# Patient Record
Sex: Female | Born: 1975 | Race: White | Hispanic: No | Smoking: Former smoker
Health system: Southern US, Community
[De-identification: ages and names within clinical notes are randomized; demographics above are authoritative.]

## PROBLEM LIST (undated history)

## (undated) DIAGNOSIS — G629 Polyneuropathy, unspecified: Secondary | ICD-10-CM

## (undated) DIAGNOSIS — M5136 Other intervertebral disc degeneration, lumbar region: Secondary | ICD-10-CM

## (undated) DIAGNOSIS — M5134 Other intervertebral disc degeneration, thoracic region: Secondary | ICD-10-CM

## (undated) DIAGNOSIS — C801 Malignant (primary) neoplasm, unspecified: Secondary | ICD-10-CM

## (undated) DIAGNOSIS — M50323 Other cervical disc degeneration at C6-C7 level: Secondary | ICD-10-CM

## (undated) DIAGNOSIS — M51369 Other intervertebral disc degeneration, lumbar region without mention of lumbar back pain or lower extremity pain: Secondary | ICD-10-CM

## (undated) HISTORY — PX: BREAST SURGERY: SHX581

## (undated) HISTORY — PX: ABDOMINAL HYSTERECTOMY: SHX81

---

## 2008-10-26 DIAGNOSIS — K219 Gastro-esophageal reflux disease without esophagitis: Secondary | ICD-10-CM | POA: Insufficient documentation

## 2010-11-13 DIAGNOSIS — E669 Obesity, unspecified: Secondary | ICD-10-CM | POA: Insufficient documentation

## 2010-11-13 DIAGNOSIS — M461 Sacroiliitis, not elsewhere classified: Secondary | ICD-10-CM | POA: Insufficient documentation

## 2010-11-13 DIAGNOSIS — J45909 Unspecified asthma, uncomplicated: Secondary | ICD-10-CM | POA: Insufficient documentation

## 2010-12-16 DIAGNOSIS — R1312 Dysphagia, oropharyngeal phase: Secondary | ICD-10-CM | POA: Insufficient documentation

## 2012-01-28 DIAGNOSIS — C50919 Malignant neoplasm of unspecified site of unspecified female breast: Secondary | ICD-10-CM | POA: Insufficient documentation

## 2012-03-09 DIAGNOSIS — J45901 Unspecified asthma with (acute) exacerbation: Secondary | ICD-10-CM | POA: Insufficient documentation

## 2013-06-27 DIAGNOSIS — G4733 Obstructive sleep apnea (adult) (pediatric): Secondary | ICD-10-CM | POA: Insufficient documentation

## 2014-01-05 ENCOUNTER — Emergency Department: Payer: Self-pay | Admitting: Emergency Medicine

## 2014-03-14 DIAGNOSIS — G5603 Carpal tunnel syndrome, bilateral upper limbs: Secondary | ICD-10-CM | POA: Insufficient documentation

## 2014-04-13 DIAGNOSIS — G894 Chronic pain syndrome: Secondary | ICD-10-CM | POA: Insufficient documentation

## 2014-04-13 DIAGNOSIS — G629 Polyneuropathy, unspecified: Secondary | ICD-10-CM | POA: Insufficient documentation

## 2014-05-05 DIAGNOSIS — N2889 Other specified disorders of kidney and ureter: Secondary | ICD-10-CM | POA: Insufficient documentation

## 2014-06-22 DIAGNOSIS — F32A Depression, unspecified: Secondary | ICD-10-CM | POA: Insufficient documentation

## 2014-08-17 DIAGNOSIS — N393 Stress incontinence (female) (male): Secondary | ICD-10-CM | POA: Insufficient documentation

## 2014-09-21 DIAGNOSIS — K2100 Gastro-esophageal reflux disease with esophagitis, without bleeding: Secondary | ICD-10-CM | POA: Insufficient documentation

## 2015-05-11 DIAGNOSIS — J45909 Unspecified asthma, uncomplicated: Secondary | ICD-10-CM | POA: Insufficient documentation

## 2015-05-11 DIAGNOSIS — Z853 Personal history of malignant neoplasm of breast: Secondary | ICD-10-CM | POA: Insufficient documentation

## 2015-05-11 DIAGNOSIS — G8929 Other chronic pain: Secondary | ICD-10-CM | POA: Insufficient documentation

## 2015-05-11 DIAGNOSIS — Z7689 Persons encountering health services in other specified circumstances: Secondary | ICD-10-CM | POA: Insufficient documentation

## 2015-05-11 DIAGNOSIS — M533 Sacrococcygeal disorders, not elsewhere classified: Secondary | ICD-10-CM | POA: Insufficient documentation

## 2015-05-11 DIAGNOSIS — J301 Allergic rhinitis due to pollen: Secondary | ICD-10-CM | POA: Insufficient documentation

## 2015-06-24 DIAGNOSIS — L02219 Cutaneous abscess of trunk, unspecified: Secondary | ICD-10-CM | POA: Insufficient documentation

## 2015-11-15 DIAGNOSIS — K279 Peptic ulcer, site unspecified, unspecified as acute or chronic, without hemorrhage or perforation: Secondary | ICD-10-CM | POA: Insufficient documentation

## 2016-11-01 DIAGNOSIS — F1220 Cannabis dependence, uncomplicated: Secondary | ICD-10-CM | POA: Insufficient documentation

## 2016-11-01 DIAGNOSIS — F410 Panic disorder [episodic paroxysmal anxiety] without agoraphobia: Secondary | ICD-10-CM | POA: Insufficient documentation

## 2019-02-07 ENCOUNTER — Other Ambulatory Visit: Payer: Self-pay

## 2019-02-07 ENCOUNTER — Encounter: Payer: Self-pay | Admitting: Emergency Medicine

## 2019-02-07 ENCOUNTER — Ambulatory Visit
Admission: EM | Admit: 2019-02-07 | Discharge: 2019-02-07 | Disposition: A | Discharge status: Home / Self Care | Payer: Medicaid Other | Attending: Internal Medicine | Admitting: Internal Medicine

## 2019-02-07 ENCOUNTER — Emergency Department: Payer: Medicaid Other

## 2019-02-07 DIAGNOSIS — S161XXA Strain of muscle, fascia and tendon at neck level, initial encounter: Secondary | ICD-10-CM

## 2019-02-07 DIAGNOSIS — F17210 Nicotine dependence, cigarettes, uncomplicated: Secondary | ICD-10-CM | POA: Insufficient documentation

## 2019-02-07 DIAGNOSIS — Z79899 Other long term (current) drug therapy: Secondary | ICD-10-CM | POA: Diagnosis not present

## 2019-02-07 DIAGNOSIS — R51 Headache: Secondary | ICD-10-CM | POA: Diagnosis not present

## 2019-02-07 DIAGNOSIS — G4459 Other complicated headache syndrome: Secondary | ICD-10-CM | POA: Diagnosis not present

## 2019-02-07 DIAGNOSIS — W01198A Fall on same level from slipping, tripping and stumbling with subsequent striking against other object, initial encounter: Secondary | ICD-10-CM

## 2019-02-07 HISTORY — DX: Other cervical disc degeneration at C6-C7 level: M50.323

## 2019-02-07 HISTORY — DX: Other intervertebral disc degeneration, lumbar region without mention of lumbar back pain or lower extremity pain: M51.369

## 2019-02-07 HISTORY — DX: Other intervertebral disc degeneration, lumbar region: M51.36

## 2019-02-07 HISTORY — DX: Malignant (primary) neoplasm, unspecified: C80.1

## 2019-02-07 HISTORY — DX: Other intervertebral disc degeneration, thoracic region: M51.34

## 2019-02-07 HISTORY — DX: Polyneuropathy, unspecified: G62.9

## 2019-02-07 MED ORDER — KETOROLAC TROMETHAMINE 60 MG/2ML IM SOLN
60.0000 mg | Freq: Once | INTRAMUSCULAR | Status: AC
  Administered 2019-02-07: 18:00:00 60 mg via INTRAMUSCULAR

## 2019-02-07 NOTE — ED Provider Notes (Signed)
[facility]    CSN: [id number] Arrival date & time: 02/07/19  1455      History   Chief Complaint Chief Complaint  Patient presents with  . Headache  . Nausea    HPI Tina Nicholson is a 43 y.o. female. who fell 4 days ago on her wet porch and landed on her buttocks and hit her head on the rail. She just sat up and got in the house, then went to [facility] and was seen by a provider but was not touched by provider, only questioned. She was not prescribed anything for pain. Has been using heat and ice, hot baths, and since last night and this am Tylenol.  Initially the HA was mild, but as the days have gone on, has been worse. She had a large " goose egg" elongated swollen area on her R occipital scalp which has resolved. She iced it right away. She used to see a chiropractor which helped her a lot.   Past Medical History:  Diagnosis Date  . Cancer [facility])   R breast cancer  SURGERIES- L mastectomy  Lumbar steroid injections off and on x 3 years.   OB History   No obstetric history on file.      Home Medications    Prior to Admission medications   Medication Sig Start Date End Date Taking? Authorizing Provider  albuterol (VENTOLIN HFA) 108 (90 Base) MCG/ACT inhaler Inhale into the lungs. 04/15/17 02/02/20 Yes [provider]  anastrozole (ARIMIDEX) 1 MG tablet Take by mouth. 01/19/17  Yes [provider]  busPIRone (BUSPAR) 7.5 MG tablet Take by mouth. 06/21/18  Yes [provider]  cetirizine (ZYRTEC) 10 MG tablet Take by mouth. 06/21/18  Yes [provider]  famotidine (PEPCID) 40 MG tablet Take by mouth. 01/28/19 01/28/20 Yes [provider]  fluticasone (FLONASE) 50 MCG/ACT nasal spray Place into the nose. 05/11/15  Yes [provider]  Fluticasone-Salmeterol (ADVAIR DISKUS) 250-50 MCG/DOSE AEPB INHALE ONE DOSE BY MOUTH TWICE DAILY 09/14/15  Yes [provider]  methocarbamol (ROBAXIN) 500 MG  tablet Take by mouth. 06/21/18  Yes [provider]  montelukast (SINGULAIR) 10 MG tablet Take by mouth. 04/15/17 02/02/20 Yes [provider]  pantoprazole (PROTONIX) 40 MG tablet Take by mouth. 09/07/18 09/07/19 Yes [provider]  pregabalin (LYRICA) 75 MG capsule TAKE 1 CAPSULE BY MOUTH 3 TIMES DAILY 02/09/18  Yes [provider]  Tiotropium Bromide Monohydrate (SPIRIVA RESPIMAT) 1.25 MCG/ACT AERS Inhale into the lungs. 02/02/19 02/02/20 Yes [provider]    Family History History reviewed. No pertinent family history.  Social History Social History   Tobacco Use  . Smoking status: Current Every Day Smoker  . Smokeless tobacco: Never Used  Substance Use Topics  . Alcohol use: Not Currently  . Drug use: Not Currently     Allergies   Sulfa antibiotics   Review of Systems Review of Systems  Constitutional: Negative for appetite change, chills, diaphoresis, fatigue and fever.  HENT: Negative for congestion, ear discharge, ear pain and hearing loss.   Eyes: Positive for visual disturbance. Negative for pain, discharge and itching.  Respiratory: Negative for cough, chest tightness and shortness of breath.   Cardiovascular: Negative for chest pain.  Gastrointestinal: Positive for nausea. Negative for vomiting.  Genitourinary: Negative for hematuria.  Musculoskeletal: Positive for arthralgias, back pain, gait problem, myalgias, neck pain and neck stiffness.  Skin: Negative for rash.  Neurological: Positive for numbness.  Has history or neuropathy from chemo   Physical Exam Triage Vital Signs ED Triage Vitals  Enc Vitals Group     BP 02/07/19 1542 (!) 146/92     Pulse Rate 02/07/19 1542 (!) 102     Resp 02/07/19 1542 18     Temp 02/07/19 1542 98.5 F (36.9 C)     Temp Source 02/07/19 1542 Oral     SpO2 02/07/19 1542 100 %     Weight 02/07/19 1540 200 lb (90.7 kg)     Height 02/07/19 1540 5\' 1"  (1.549 m)     Head Circumference  --      Peak Flow --      Pain Score 02/07/19 1540 10     Pain Loc --      Pain Edu? --      Excl. in [place]? --    No data found.  Updated Vital Signs BP (!) 146/92 (BP Location: Right Arm)   Pulse (!) 102   Temp 98.5 F (36.9 C) (Oral)   Resp 18   Ht 5\' 1"  (1.549 m)   Wt 200 lb (90.7 kg)   SpO2 100%   BMI 37.79 kg/m   Visual Acuity Right Eye Distance:   Left Eye Distance:   Bilateral Distance:    Right Eye Near:   Left Eye Near:    Bilateral Near:     Physical Exam Constitutional:      General: She is in acute distress.     Appearance: She is obese.     Comments: Is in moderate pain.   HENT:     Head: Normocephalic and atraumatic.     Mouth/Throat:     Mouth: Mucous membranes are moist.  Eyes:     General: No scleral icterus.    Extraocular Movements: Extraocular movements intact.     Right eye: No nystagmus.     Pupils: Pupils are equal, round, and reactive to light.  Neck:     Comments: Has tenderness of her C- vertebral area and soft tissue on the trapezius as well. ROM to anterior flexion and R rotation provoked pain. Has mild stiffness with rotation to the R.  Cardiovascular:     Rate and Rhythm: Normal rate and regular rhythm.  Pulmonary:     Effort: Pulmonary effort is normal.     Breath sounds: Normal breath sounds.  Lymphadenopathy:     Cervical: No cervical adenopathy.  Neurological:     Mental Status: She is alert and oriented to person, place, and time.     Cranial Nerves: No cranial nerve deficit or facial asymmetry.     Sensory: No sensory deficit.     Motor: No weakness.     Coordination: Romberg sign negative. Coordination normal.     Gait: Gait normal.  Psychiatric:        Mood and Affect: Mood normal.        Speech: Speech normal.        Behavior: Behavior normal.    UC Treatments / Results  Labs (all labs ordered are listed, but only abnormal results are displayed) Labs Reviewed - No data to display  EKG   Radiology No  results found.  Procedures   Medications Ordered in UC Medications - No data to display  Initial Impression / Assessment and Plan / UC Course  I have reviewed the triage vital signs and the nursing notes. Our CT tech is gone for the day, so she was given  Toradol 60 mg for pain and advised to go to ER. She was willing. See directions.    Final Clinical Impressions(s) / UC Diagnoses   Final diagnoses:  None   Discharge Instructions   None    ED Prescriptions    None     Controlled Substance Prescriptions [place] Controlled Substance Registry consulted?    Shelby Mattocks, [place] 02/07/19 1940

## 2019-02-07 NOTE — ED Triage Notes (Signed)
Pt c/o headache. Started about 4 days ago. She states that she fell on her buttock and hit her head. Pt is tearful during triage. She states that the pain radiates into her neck and left shoulder.

## 2019-02-07 NOTE — Discharge Instructions (Signed)
Go to ER to get neck and head CT scan and if this is negative for fracture, then go see your chiropractor to help with your neck strain which may help the headache since it seems to be coming from your neck.

## 2019-02-07 NOTE — ED Triage Notes (Signed)
Pt presents to ED from urgent care with worsening headache. Pt states she fell about 2 weeks ago on her wood porch and hit the back of her head on the corner post of the railing of her porch. Also injured her coccyx but states that is feeling better. Pt states she woke up Thursday morning with pain across the back of her head and down into her neck. States it feels like someone squeezing all the way around her head. Was told to come to ED for CT scans of her head and neck.

## 2019-02-08 ENCOUNTER — Emergency Department
Admission: EM | Admit: 2019-02-08 | Discharge: 2019-02-08 | Disposition: A | Discharge status: Home / Self Care | Payer: Medicaid Other | Attending: Emergency Medicine | Admitting: Emergency Medicine

## 2019-02-08 DIAGNOSIS — R519 Headache, unspecified: Secondary | ICD-10-CM

## 2019-02-08 MED ORDER — BUTALBITAL-APAP-CAFFEINE 50-325-40 MG PO TABS: 1.0000 | ORAL_TABLET | Freq: Four times a day (QID) | ORAL | 0 refills | Status: AC | PRN

## 2019-02-08 MED ORDER — KETOROLAC TROMETHAMINE 30 MG/ML IJ SOLN
15.0000 mg | Freq: Once | INTRAMUSCULAR | Status: AC
  Administered 2019-02-08: 15 mg via INTRAVENOUS
  Filled 2019-02-08: qty 1

## 2019-02-08 MED ORDER — SODIUM CHLORIDE 0.9 % IV BOLUS
1000.0000 mL | Freq: Once | INTRAVENOUS | Status: AC
  Administered 2019-02-08: 1000 mL via INTRAVENOUS

## 2019-02-08 MED ORDER — METOCLOPRAMIDE HCL 5 MG/ML IJ SOLN
10.0000 mg | Freq: Once | INTRAMUSCULAR | Status: AC
  Administered 2019-02-08: 10 mg via INTRAVENOUS
  Filled 2019-02-08: qty 2

## 2019-02-08 MED ORDER — BUTALBITAL-APAP-CAFFEINE 50-325-40 MG PO TABS
1.0000 | ORAL_TABLET | Freq: Once | ORAL | Status: AC
  Administered 2019-02-08: 1 via ORAL
  Filled 2019-02-08: qty 1

## 2019-02-08 NOTE — ED Provider Notes (Signed)
[facility] Emergency Department Provider Note  ____________________________________________  Time seen: Approximately 1:21 AM  I have reviewed the triage vital signs and the nursing notes.   HISTORY  Chief Complaint Fall and Headache   HPI Tina Nicholson is a 43 y.o. 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  Patient reports that she had a fall 4 weeks ago.  She was walking on her deck which was wet.  She slipped and fell onto her buttock.  She hit her head onto the guardrail.  She was doing well until 4 days ago.  She woke up with a mild occipital headache.  Over the course of the last 4 days a headache has become progressively worse.  She has been taking Tylenol at home with no significant relief.  The headache starts in the occipital region and radiates to the rest of the head, it is sharp, and moderate in intensity.  Patient reports that she has a history of tension headaches however those are usually responsive to Tylenol.  No thunderclap headache, no dizziness, no LOC, no changes in vision, no fever.  Patient went to urgent care today and was sent here for CT scan.   Past Medical History:  Diagnosis Date  . Cancer ([place])   . Disc degeneration, lumbar   . Neuropathy    from chemo  . Other cervical disc degeneration at C6-C7 level    per pt history  . Other intervertebral disc degeneration, thoracic region     There are no active problems to display for this patient.   Past Surgical History:  Procedure Laterality Date  . ABDOMINAL HYSTERECTOMY    . BREAST SURGERY      Prior to Admission medications   Medication Sig Start Date End Date Taking? Authorizing Provider  albuterol (VENTOLIN HFA) 108 (90 Base) MCG/ACT inhaler Inhale into the lungs. 04/15/17 02/02/20  [provider]  anastrozole (ARIMIDEX) 1 MG tablet Take by mouth. 01/19/17   [provider]   busPIRone (BUSPAR) 7.5 MG tablet Take by mouth. 06/21/18   [provider]  butalbital-acetaminophen-caffeine (FIORICET) 50-325-40 MG tablet Take 1-2 tablets by mouth every 6 (six) hours as needed for headache. 02/08/19 02/08/20  Alfred Levins, [place], MD  cetirizine (ZYRTEC) 10 MG tablet Take by mouth. 06/21/18   [provider]  famotidine (PEPCID) 40 MG tablet Take by mouth. 01/28/19 01/28/20  [provider]  fluticasone (FLONASE) 50 MCG/ACT nasal spray Place into the nose. 05/11/15   [provider]  Fluticasone-Salmeterol (ADVAIR DISKUS) 250-50 MCG/DOSE AEPB INHALE ONE DOSE BY MOUTH TWICE DAILY 09/14/15   [provider]  methocarbamol (ROBAXIN) 500 MG tablet Take by mouth. 06/21/18   [provider]  montelukast (SINGULAIR) 10 MG tablet Take by mouth. 04/15/17 02/02/20  [provider]  pantoprazole (PROTONIX) 40 MG tablet Take by mouth. 09/07/18 09/07/19  [provider]  pregabalin (LYRICA) 75 MG capsule TAKE 1 CAPSULE BY MOUTH 3 TIMES DAILY 02/09/18   [provider]  Tiotropium Bromide Monohydrate (SPIRIVA RESPIMAT) 1.25 MCG/ACT AERS Inhale into the lungs. 02/02/19 02/02/20  [provider]    Allergies Sulfa antibiotics  No family history on file.  Social History Social History   Tobacco Use  . Smoking status: Current Every Day Smoker    Types: Cigarettes  . Smokeless tobacco: Never Used  Substance Use Topics  . Alcohol use: Not Currently  . Drug use:  Not Currently    Review of Systems  Constitutional: Negative for fever. Eyes: Negative for visual changes. ENT: Negative for sore throat. Neck: No neck pain  Cardiovascular: Negative for chest pain. Respiratory: Negative for shortness of breath. Gastrointestinal: Negative for abdominal pain, vomiting or diarrhea. Genitourinary: Negative for dysuria. Musculoskeletal: Negative for back pain. Skin: Negative for rash. Neurological: Negative for   weakness or numbness. + HA Psych: No SI or HI  ____________________________________________   PHYSICAL EXAM:  VITAL SIGNS: ED Triage Vitals  Enc Vitals Group     BP 02/07/19 2106 126/78     Pulse Rate 02/07/19 2106 87     Resp 02/07/19 2106 18     Temp 02/07/19 2106 98.3 F (36.8 C)     Temp Source 02/07/19 2106 Oral     SpO2 02/07/19 2106 98 %     Weight 02/07/19 2107 200 lb (90.7 kg)     Height 02/07/19 2107 5\' 1"  (1.549 m)     Head Circumference --      Peak Flow --      Pain Score 02/07/19 2105 6     Pain Loc --      Pain Edu? --      Excl. in [place]? --     Constitutional: Alert and oriented. Well appearing and in no apparent distress. HEENT:      Head: Normocephalic and atraumatic.         Eyes: Conjunctivae are normal. Sclera is non-icteric.  No raccoon eyes      Ears: No hemotympanum      Mouth/Throat: Mucous membranes are moist.       Neck: Supple with no signs of meningismus.  No C-spine tenderness Cardiovascular: Regular rate and rhythm. No murmurs, gallops, or rubs.  Respiratory: Normal respiratory effort. Lungs are clear to auscultation bilaterally. No wheezes, crackles, or rhonchi.  Gastrointestinal: Soft, non tender, and non distended with positive bowel sounds. No rebound or guarding. Musculoskeletal: Nontender with normal range of motion in all extremities. No T and L spine tenderness Neurologic: Normal speech and language. Face is symmetric. Moving all extremities. No gross focal neurologic deficits are appreciated. Skin: Skin is warm, dry and intact. No rash noted. Psychiatric: Mood and affect are normal. Speech and behavior are normal.  ____________________________________________   LABS (all labs ordered are listed, but only abnormal results are displayed)  Labs Reviewed - No data to display ____________________________________________  EKG  none  ____________________________________________  RADIOLOGY  I have personally reviewed the images  performed during this visit and I agree with the Radiologist's read.   Interpretation by Radiologist:  Ct Head Wo Contrast  Result Date: 02/07/2019 CLINICAL DATA:  Worsening headache after falling 2 weeks ago. EXAM: CT HEAD WITHOUT CONTRAST CT CERVICAL SPINE WITHOUT CONTRAST TECHNIQUE: Multidetector CT imaging of the head and cervical spine was performed following the standard protocol without intravenous contrast. Multiplanar CT image reconstructions of the cervical spine were also generated. COMPARISON:  None. FINDINGS: CT HEAD FINDINGS Brain: There is no mass, hemorrhage or extra-axial collection. The size and configuration of the ventricles and extra-axial CSF spaces are normal. The brain parenchyma is normal, without evidence of acute or chronic infarction. Vascular: No abnormal hyperdensity of the major intracranial arteries or dural venous sinuses. No intracranial atherosclerosis. Skull: The visualized skull base, calvarium and extracranial soft tissues are normal. Sinuses/Orbits: No fluid levels or advanced mucosal thickening of the visualized paranasal sinuses. No mastoid or middle ear effusion. The orbits are normal.  CT CERVICAL SPINE FINDINGS Alignment: No static subluxation. Facets are aligned. Occipital condyles are normally positioned. Skull base and vertebrae: No acute fracture. Soft tissues and spinal canal: No prevertebral fluid or swelling. No visible canal hematoma. Disc levels: No advanced spinal canal or neural foraminal stenosis. Upper chest: No pneumothorax, pulmonary nodule or pleural effusion. Other: Normal visualized paraspinal cervical soft tissues. IMPRESSION: No acute abnormality of the head or cervical spine. Electronically Signed   By: Ulyses Jarred M.D.   On: 02/07/2019 23:47   Ct Cervical Spine Wo Contrast  Result Date: 02/07/2019 CLINICAL DATA:  Worsening headache after falling 2 weeks ago. EXAM: CT HEAD WITHOUT CONTRAST CT CERVICAL SPINE WITHOUT CONTRAST TECHNIQUE:  Multidetector CT imaging of the head and cervical spine was performed following the standard protocol without intravenous contrast. Multiplanar CT image reconstructions of the cervical spine were also generated. COMPARISON:  None. FINDINGS: CT HEAD FINDINGS Brain: There is no mass, hemorrhage or extra-axial collection. The size and configuration of the ventricles and extra-axial CSF spaces are normal. The brain parenchyma is normal, without evidence of acute or chronic infarction. Vascular: No abnormal hyperdensity of the major intracranial arteries or dural venous sinuses. No intracranial atherosclerosis. Skull: The visualized skull base, calvarium and extracranial soft tissues are normal. Sinuses/Orbits: No fluid levels or advanced mucosal thickening of the visualized paranasal sinuses. No mastoid or middle ear effusion. The orbits are normal. CT CERVICAL SPINE FINDINGS Alignment: No static subluxation. Facets are aligned. Occipital condyles are normally positioned. Skull base and vertebrae: No acute fracture. Soft tissues and spinal canal: No prevertebral fluid or swelling. No visible canal hematoma. Disc levels: No advanced spinal canal or neural foraminal stenosis. Upper chest: No pneumothorax, pulmonary nodule or pleural effusion. Other: Normal visualized paraspinal cervical soft tissues. IMPRESSION: No acute abnormality of the head or cervical spine. Electronically Signed   By: Ulyses Jarred M.D.   On: 02/07/2019 23:47     ____________________________________________   PROCEDURES  Procedure(s) performed: None Procedures Critical Care performed:  None ____________________________________________   INITIAL IMPRESSION / ASSESSMENT AND PLAN / ED COURSE  43 y.o. 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  Patient is extremely well-appearing and in no distress with normal vital signs, neurologically intact, no  signs or symptoms of basilar skull fracture, head is atraumatic, no CT and L-spine tenderness.  CT head and neck negative for any acute injuries. Will treat with a migraine cocktail.  Low suspicion for more serious or life threatening etiology of HA based on history and exam. No sudden onset thunderclap HA, onset with exertion, vomiting, focal neurologic deficits, to suggest increased risk of subarachnoid hemorrhage. No fever, neck pain, neck stiffness, or meningismus on exam to suggest meningitis. No fevers, altered mental status, unusual behavior to suggest encephalitis. No focal neurologic deficits by history or exam to suggest central venous thrombosis. No constitutional symptoms including fever, fatigue, weight loss, temporal scalp tenderness, jaw claudication, visual loss, to suggest temporal arteritis. No immunocompromise to suggest increased risk for intracranial infectious disease. No visual changes or findings on ocular exam to suggest acute angle closure glaucoma. No reports of toxic exposures including carbon monoxide or other household members with similar symptoms.   Clinical Course as of Feb 07 301  Tue Feb 08, 2019  0257 Headache is fully resolved.  Patient feels markedly improved.  Requesting to be discharged home at this time.  Will provide prescription of  Fioricet.  Recommended follow-up with PCP and discussed my standard return precautions   [CV]    Clinical Course User Index [CV] Alfred Levins [place], MD      As part of my medical decision making, I reviewed the following data within the [facility] notes reviewed and incorporated, Old chart reviewed, Radiograph reviewed , Notes from prior ED visits and [place] Controlled Substance Database   Patient was evaluated in Emergency Department today for the symptoms described in the history of present illness. Patient was evaluated in the context of the global COVID-19 pandemic, which necessitated consideration  that the patient might be at risk for infection with the SARS-CoV-2 virus that causes COVID-19. Institutional protocols and algorithms that pertain to the evaluation of patients at risk for COVID-19 are in a state of rapid change based on information released by regulatory bodies including the CDC and federal and state organizations. These policies and algorithms were followed during the patient's care in the ED.   ____________________________________________   FINAL CLINICAL IMPRESSION(S) / ED DIAGNOSES   Final diagnoses:  Acute nonintractable headache, unspecified headache type      NEW MEDICATIONS STARTED DURING THIS VISIT:  ED Discharge Orders         Ordered    butalbital-acetaminophen-caffeine (FIORICET) 50-325-40 MG tablet  Every 6 hours PRN     02/08/19 0257           Note:  This document was prepared using Dragon voice recognition software and may include unintentional dictation errors.    Alfred Levins, [place], MD 02/08/19 [phone]

## 2019-02-08 NOTE — ED Notes (Signed)
Patient states she had a fall about a month ago and since has had a headache as a result.

## 2019-02-08 NOTE — Discharge Instructions (Addendum)

## 2020-01-01 IMAGING — CT CT HEAD WITHOUT CONTRAST
5 of 7 series · 16 of 47 positions shown, 17 images · non-contrast
Comparison: None.

CLINICAL DATA: Worsening headache after falling 2 weeks ago.

EXAM:
CT HEAD WITHOUT CONTRAST
CT CERVICAL SPINE WITHOUT CONTRAST
TECHNIQUE: Multidetector CT imaging of the head and cervical spine was
performed following the standard protocol without intravenous
contrast. Multiplanar CT image reconstructions of the cervical spine
were also generated.

[Series 2: head wo · axial · 0.44mm/px · z∈[-129,-79]mm · 2 of 32 slices shown, 3 images]
[im 11/32  brain]
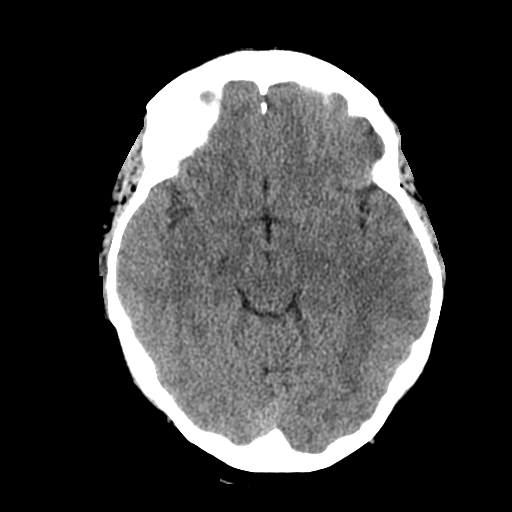
[im 11/32  bone]
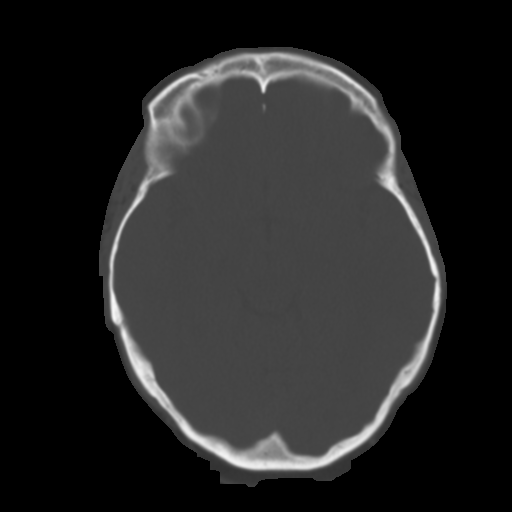
[im 21/32  brain]
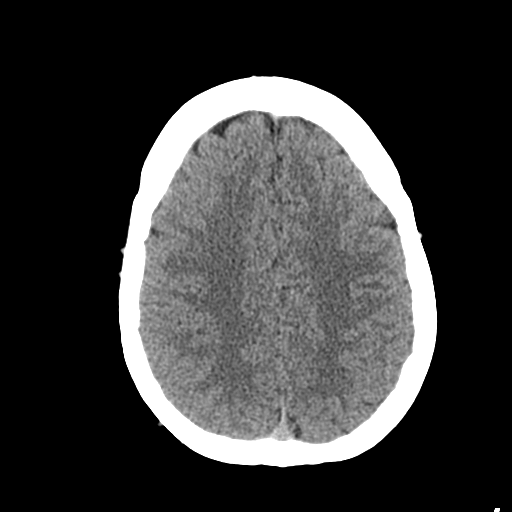

[Series 5: c spine soft · axial · 0.37mm/px · z∈[-324,-310]mm · 2 of 80 slices shown]
[im 8/80  brain]
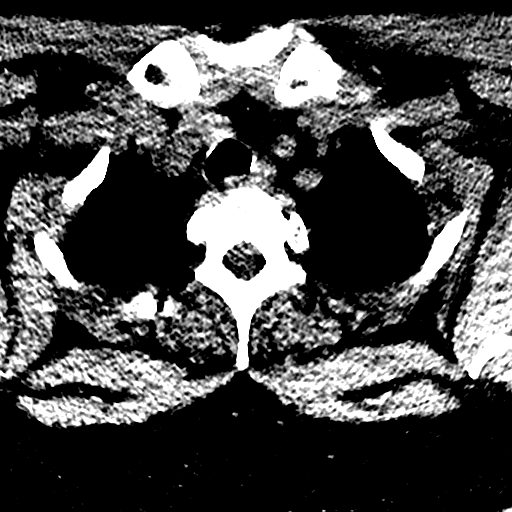
[im 15/80  brain]
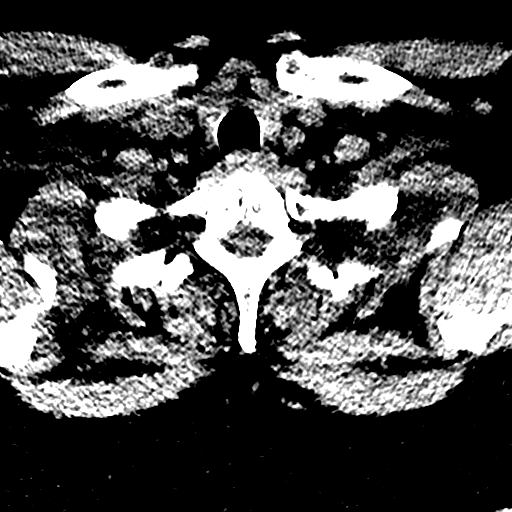

[Series 6: coronal soft tissue · coronal · 0.29mm/px · 3 of 66 slices shown]
[im 14/66  brain]
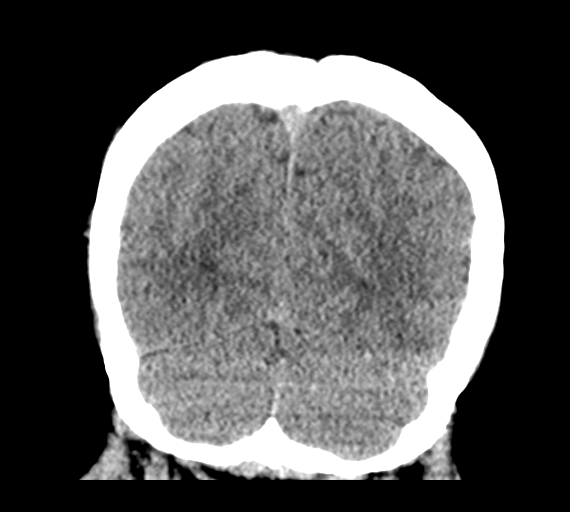
[im 27/66  brain]
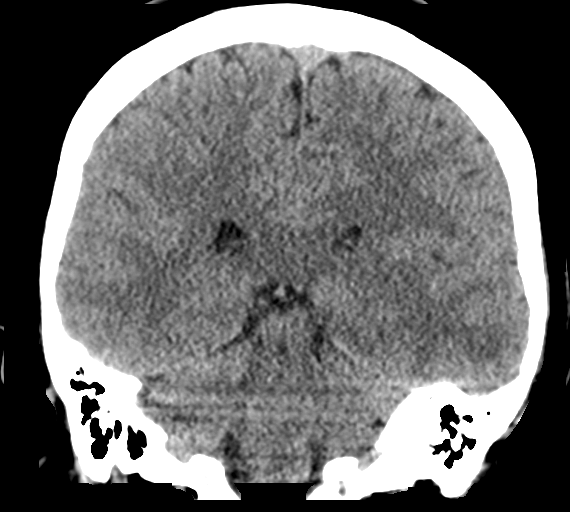
[im 40/66  brain]
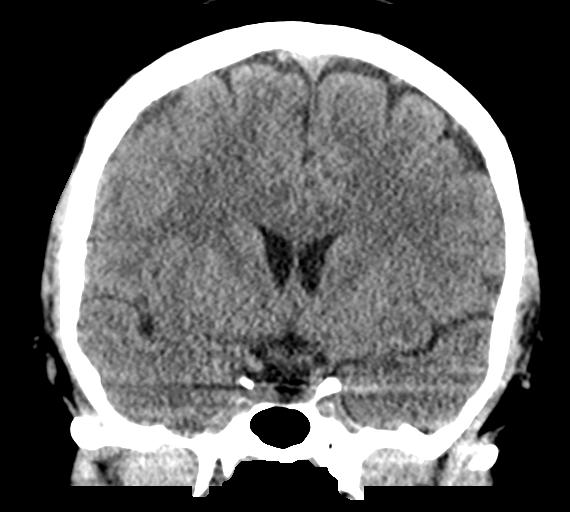

[Series 7: sagittal soft tissue · sagittal · 0.30mm/px · 1 of 54 slices shown]
[im 27/54  brain]
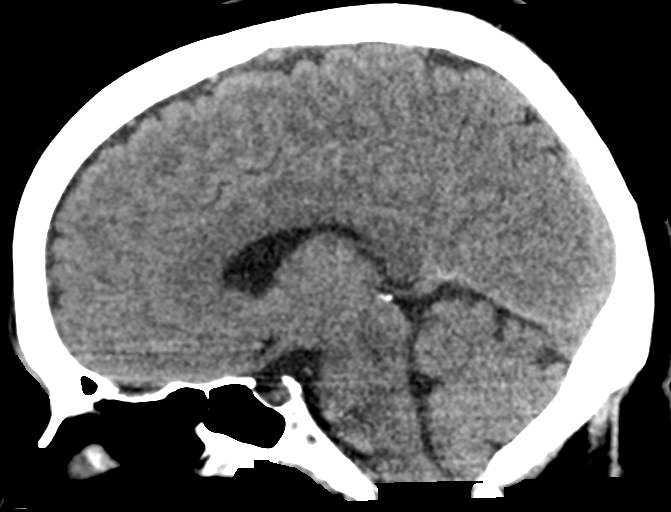

[Series 10: orthogonal bone · axial · 0.19mm/px · z∈[-351,-200]mm · 8 of 96 slices shown]
[im 8/96  bone]
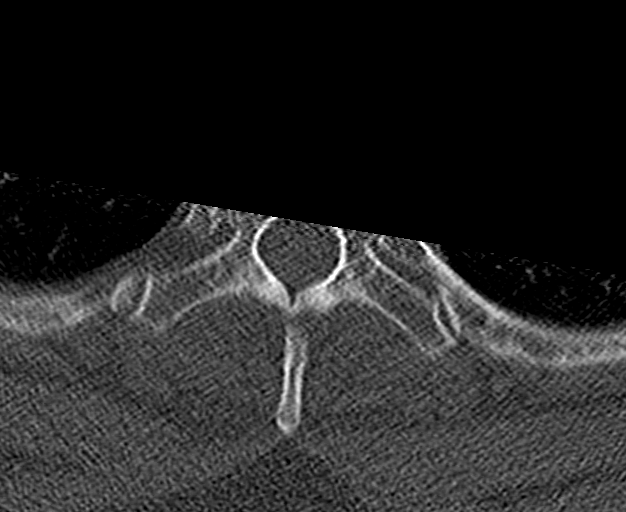
[im 22/96  bone]
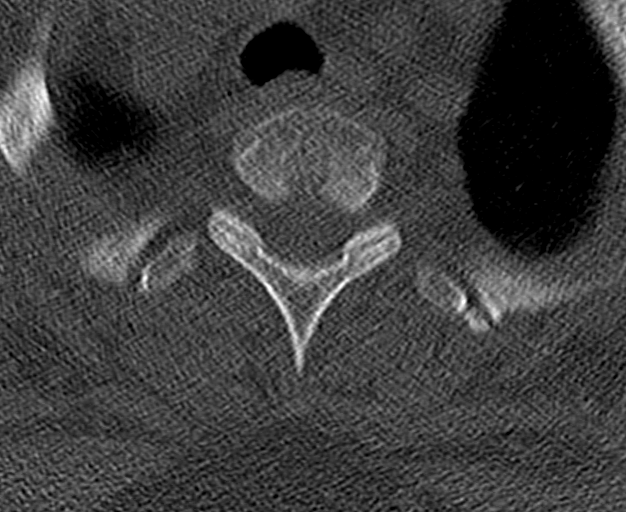
[im 30/96  bone]
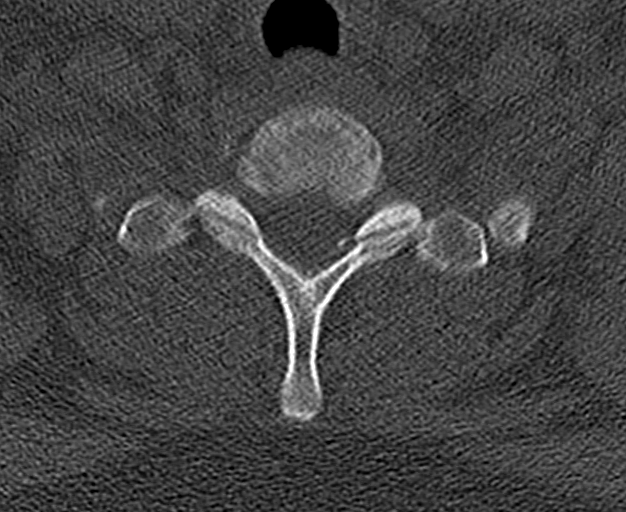
[im 44/96  bone]
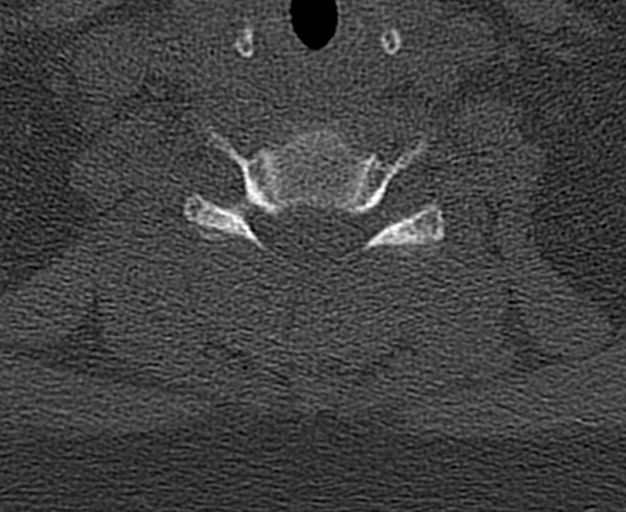
[im 52/96  bone]
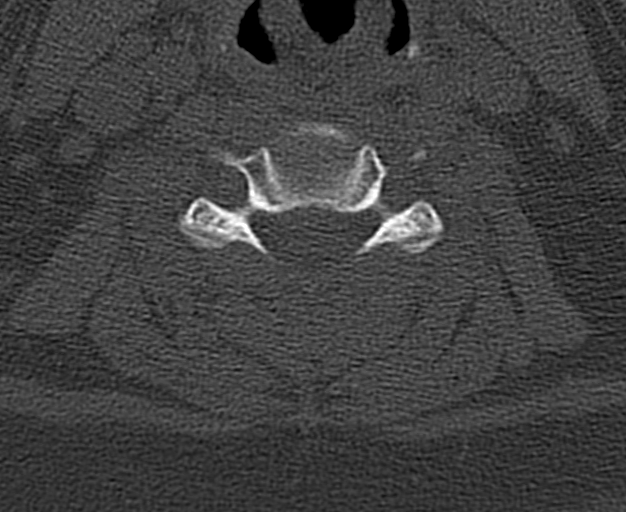
[im 66/96  bone]
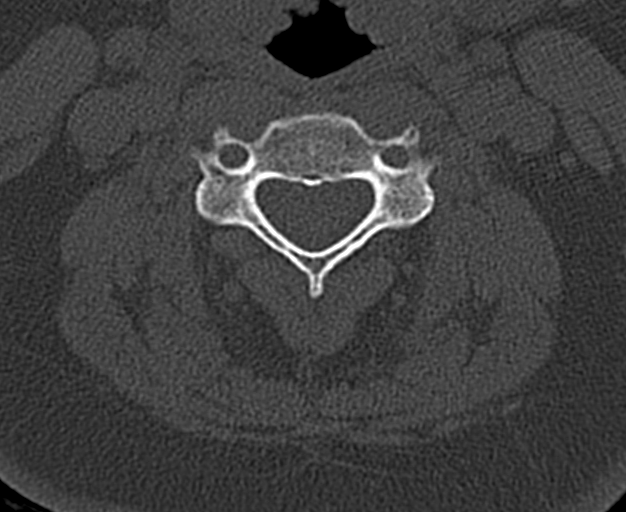
[im 74/96  bone]
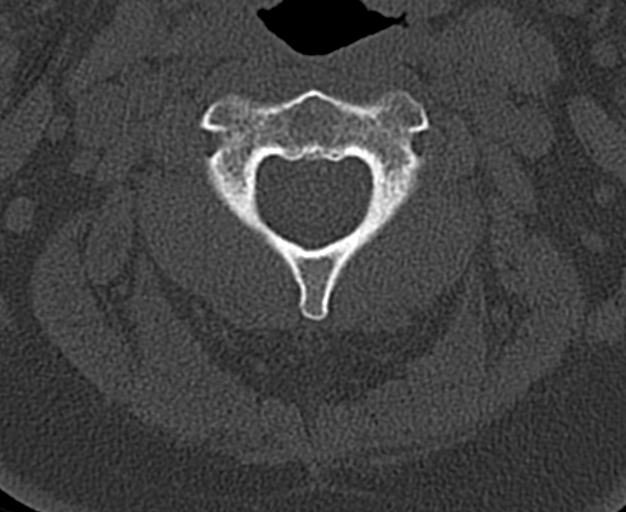
[im 88/96  bone]
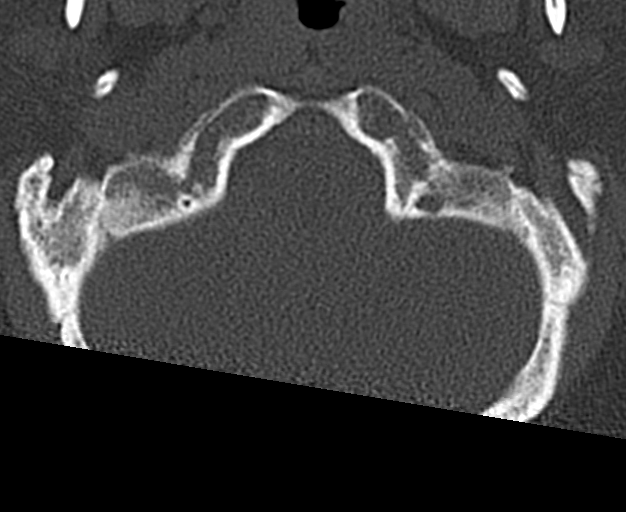

[16 of 47 positions shown; findings below may reference images not displayed]

FINDINGS: CT HEAD FINDINGS

Brain: There is no mass, hemorrhage or extra-axial collection. The
size and configuration of the ventricles and extra-axial CSF spaces
are normal. The brain parenchyma is normal, without evidence of
acute or chronic infarction.

Vascular: No abnormal hyperdensity of the major intracranial
arteries or dural venous sinuses. No intracranial atherosclerosis.

Skull: The visualized skull base, calvarium and extracranial soft
tissues are normal.

Sinuses/Orbits: No fluid levels or advanced mucosal thickening of
the visualized paranasal sinuses. No mastoid or middle ear effusion.
The orbits are normal.

CT CERVICAL SPINE FINDINGS

Alignment: No static subluxation. Facets are aligned. Occipital
condyles are normally positioned.

Skull base and vertebrae: No acute fracture.

Soft tissues and spinal canal: No prevertebral fluid or swelling. No
visible canal hematoma.

Disc levels: No advanced spinal canal or neural foraminal stenosis.

Upper chest: No pneumothorax, pulmonary nodule or pleural effusion.

Other: Normal visualized paraspinal cervical soft tissues.
IMPRESSION: No acute abnormality of the head or cervical spine.

## 2020-11-13 DIAGNOSIS — Z79899 Other long term (current) drug therapy: Secondary | ICD-10-CM | POA: Insufficient documentation

## 2020-11-13 DIAGNOSIS — M899 Disorder of bone, unspecified: Secondary | ICD-10-CM | POA: Insufficient documentation

## 2020-11-13 DIAGNOSIS — Z789 Other specified health status: Secondary | ICD-10-CM | POA: Insufficient documentation

## 2020-11-13 NOTE — Progress Notes (Signed)
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 taken over the patient's controlled substance management.  Problem-specific plan: No problem-specific Assessment & Plan notes found for this encounter.   Lab Orders     Compliance Drug Analysis, Ur     Comp. Metabolic Panel (12)     Magnesium     Vitamin B12     Sedimentation rate     25-Hydroxy vitamin D Lcms D2+D3     C-reactive protein  Imaging Orders     DG Lumbar Spine Complete W/Bend     DG Shoulder Right     DG Shoulder Left     DG Ankle Complete Left     DG Ankle Complete Right     DG Cervical Spine With Flex & Extend Referral Orders  No referral(s) requested today   Procedure Orders    No procedure(s) ordered today   Pharmacotherapy (current): Medications ordered:  No orders of the defined types were placed in this encounter.  Medications administered during this visit: Darliss Ridgel "Maudie Mercury" had no medications administered during this visit.   Pharmacological management options:  Opioid Analgesics: The patient was informed that there is no  guarantee that she would be a candidate for opioid analgesics. The decision will be made following CDC guidelines.  This decision will be based on the results of diagnostic studies, as well as Ms. Bokhari's risk profile.   Membrane stabilizer: To be determined at a later time  Muscle relaxant: To be determined at a later time  NSAID: To be determined at a later time  Other analgesic(s): To be determined at a later time   Interventional management options: Ms. Capistran was informed that there is no guarantee that she would be a candidate for interventional therapies. The decision will be based on the results of diagnostic studies, as well as Ms. Paulino's risk profile.  Procedure(s) under consideration:  Pending results of ordered test.   Provider-requested follow-up: Return for (F2F), (40 min) 2V on evaluation day, (s/p Tests).  No future appointments.  Note by: Gaspar Cola, MD Date: 11/14/2020; Time: 8:17 AM

## 2020-11-14 ENCOUNTER — Other Ambulatory Visit: Payer: Self-pay | Admitting: Pain Medicine

## 2020-11-14 ENCOUNTER — Ambulatory Visit: Payer: Medicaid Other | Attending: Pain Medicine | Admitting: Pain Medicine

## 2020-11-14 ENCOUNTER — Encounter: Payer: Self-pay | Admitting: Pain Medicine

## 2020-11-14 ENCOUNTER — Other Ambulatory Visit: Payer: Self-pay

## 2020-11-14 VITALS — BP 104/79 | HR 100 | Temp 97.0°F | Resp 18 | Ht 60.0 in | Wt 211.0 lb

## 2020-11-14 DIAGNOSIS — M25512 Pain in left shoulder: Secondary | ICD-10-CM | POA: Diagnosis present

## 2020-11-14 DIAGNOSIS — Z789 Other specified health status: Secondary | ICD-10-CM | POA: Diagnosis present

## 2020-11-14 DIAGNOSIS — M79605 Pain in left leg: Secondary | ICD-10-CM

## 2020-11-14 DIAGNOSIS — R94131 Abnormal electromyogram [EMG]: Secondary | ICD-10-CM | POA: Diagnosis present

## 2020-11-14 DIAGNOSIS — M25511 Pain in right shoulder: Secondary | ICD-10-CM | POA: Diagnosis present

## 2020-11-14 DIAGNOSIS — M5416 Radiculopathy, lumbar region: Secondary | ICD-10-CM

## 2020-11-14 DIAGNOSIS — M79604 Pain in right leg: Secondary | ICD-10-CM

## 2020-11-14 DIAGNOSIS — M792 Neuralgia and neuritis, unspecified: Secondary | ICD-10-CM | POA: Diagnosis present

## 2020-11-14 DIAGNOSIS — M5412 Radiculopathy, cervical region: Secondary | ICD-10-CM

## 2020-11-14 DIAGNOSIS — M5442 Lumbago with sciatica, left side: Secondary | ICD-10-CM

## 2020-11-14 DIAGNOSIS — M25571 Pain in right ankle and joints of right foot: Secondary | ICD-10-CM | POA: Diagnosis present

## 2020-11-14 DIAGNOSIS — M5417 Radiculopathy, lumbosacral region: Secondary | ICD-10-CM | POA: Diagnosis present

## 2020-11-14 DIAGNOSIS — M899 Disorder of bone, unspecified: Secondary | ICD-10-CM

## 2020-11-14 DIAGNOSIS — M25572 Pain in left ankle and joints of left foot: Secondary | ICD-10-CM | POA: Diagnosis present

## 2020-11-14 DIAGNOSIS — M79602 Pain in left arm: Secondary | ICD-10-CM | POA: Diagnosis present

## 2020-11-14 DIAGNOSIS — Z79899 Other long term (current) drug therapy: Secondary | ICD-10-CM | POA: Diagnosis present

## 2020-11-14 DIAGNOSIS — M21372 Foot drop, left foot: Secondary | ICD-10-CM | POA: Insufficient documentation

## 2020-11-14 DIAGNOSIS — G8929 Other chronic pain: Secondary | ICD-10-CM | POA: Diagnosis present

## 2020-11-14 DIAGNOSIS — R937 Abnormal findings on diagnostic imaging of other parts of musculoskeletal system: Secondary | ICD-10-CM

## 2020-11-14 DIAGNOSIS — G894 Chronic pain syndrome: Secondary | ICD-10-CM | POA: Diagnosis present

## 2020-11-14 NOTE — Patient Instructions (Signed)

## 2020-11-14 NOTE — Progress Notes (Signed)
Safety precautions to be maintained throughout the outpatient stay will include: orient to surroundings, keep bed in low position, maintain call bell within reach at all times, provide assistance with transfer out of bed and ambulation.  

## 2020-11-22 ENCOUNTER — Other Ambulatory Visit: Payer: Self-pay | Admitting: Pain Medicine

## 2020-11-22 LAB — COMP. METABOLIC PANEL (12)
AST: 26 IU/L (ref 0–40)
Albumin/Globulin Ratio: 1.8 (ref 1.2–2.2)
Albumin: 4.5 g/dL (ref 3.8–4.8)
Alkaline Phosphatase: 74 IU/L (ref 44–121)
BUN/Creatinine Ratio: 15 (ref 9–23)
BUN: 10 mg/dL (ref 6–24)
Bilirubin Total: 0.3 mg/dL (ref 0.0–1.2)
Calcium: 9.5 mg/dL (ref 8.7–10.2)
Chloride: 103 mmol/L (ref 96–106)
Creatinine, Ser: 0.66 mg/dL (ref 0.57–1.00)
Globulin, Total: 2.5 g/dL (ref 1.5–4.5)
Glucose: 98 mg/dL (ref 65–99)
Potassium: 3.9 mmol/L (ref 3.5–5.2)
Sodium: 143 mmol/L (ref 134–144)
Total Protein: 7 g/dL (ref 6.0–8.5)
eGFR: 109 mL/min/{1.73_m2} (ref >59)

## 2020-11-22 LAB — SEDIMENTATION RATE: Sed Rate: 2 mm/hr (ref 0–32)

## 2020-11-22 LAB — VITAMIN B12: Vitamin B-12: 576 pg/mL (ref 232–1245)

## 2020-11-22 LAB — 25-HYDROXY VITAMIN D LCMS D2+D3
25-Hydroxy, Vitamin D-2: 52 ng/mL
25-Hydroxy, Vitamin D-3: 3.5 ng/mL
25-Hydroxy, Vitamin D: 56 ng/mL

## 2020-11-22 LAB — C-REACTIVE PROTEIN: CRP: 7 mg/L (ref 0–10)

## 2020-11-22 LAB — COMPLIANCE DRUG ANALYSIS, UR

## 2020-11-22 LAB — MAGNESIUM: Magnesium: 2.3 mg/dL (ref 1.6–2.3)

## 2020-11-22 NOTE — Progress Notes (Signed)
Abnormal UDS: Drug Present not Declared for Prescription Verification   Carboxy-THC          >461     UNEXPECTED ng/mg creat   Carboxy-THC is a metabolite of tetrahydrocannabinol (THC). Source of   THC is most commonly herbal marijuana or marijuana-based products,   but THC is also present in a scheduled prescription medication.   Trace amounts of THC can be present in hemp and cannabidiol (CBD)   products. This test is not intended to distinguish between delta-9-   tetrahydrocannabinol, the predominant form of THC in most herbal or   marijuana-based products, and delta-8-tetrahydrocannabinol.

## 2020-12-18 ENCOUNTER — Other Ambulatory Visit: Payer: Self-pay | Admitting: Pain Medicine

## 2020-12-18 ENCOUNTER — Telehealth: Payer: Self-pay

## 2020-12-18 DIAGNOSIS — M5412 Radiculopathy, cervical region: Secondary | ICD-10-CM

## 2020-12-18 DIAGNOSIS — M5442 Lumbago with sciatica, left side: Secondary | ICD-10-CM

## 2020-12-18 DIAGNOSIS — M79605 Pain in left leg: Secondary | ICD-10-CM

## 2020-12-18 DIAGNOSIS — M5417 Radiculopathy, lumbosacral region: Secondary | ICD-10-CM

## 2020-12-18 DIAGNOSIS — M25511 Pain in right shoulder: Secondary | ICD-10-CM

## 2020-12-18 DIAGNOSIS — M792 Neuralgia and neuritis, unspecified: Secondary | ICD-10-CM

## 2020-12-18 DIAGNOSIS — M25572 Pain in left ankle and joints of left foot: Secondary | ICD-10-CM

## 2020-12-18 NOTE — Telephone Encounter (Signed)
Bubble message sent to Dr. Naveira. 

## 2020-12-18 NOTE — Progress Notes (Signed)
The patient was initially seen on 11/14/2020 at which time we ordered:  Imaging:     DG Lumbar Spine Complete W/Bend     DG Shoulder Right     DG Shoulder Left     DG Ankle Complete Left     DG Ankle Complete Right     DG Cervical Spine With Flex & Extend  The patient called the clinics around June 13, indicating that she had just attempted to have the x-rays done, but she was unable to do them because the orders expired.  She wants the orders to be entered again.

## 2020-12-18 NOTE — Telephone Encounter (Signed)
As seen on 11/14/20 and Dr.N put in orders fro Xray which have expired and pt is trying to have those xrays done. Is there a way to have the orders renewed ?

## 2020-12-19 NOTE — Telephone Encounter (Signed)
Pt.notified

## 2021-01-03 ENCOUNTER — Encounter: Payer: Self-pay | Admitting: Pain Medicine

## 2021-01-08 ENCOUNTER — Ambulatory Visit
Admission: RE | Admit: 2021-01-08 | Discharge: 2021-01-08 | Disposition: A | Discharge status: Home / Self Care | Payer: Medicaid Other | Source: Ambulatory Visit | Attending: Pain Medicine | Admitting: Pain Medicine

## 2021-01-08 ENCOUNTER — Ambulatory Visit
Admission: RE | Admit: 2021-01-08 | Discharge: 2021-01-08 | Disposition: A | Discharge status: Home / Self Care | Payer: Medicaid Other | Attending: Pain Medicine | Admitting: Pain Medicine

## 2021-01-08 DIAGNOSIS — M25511 Pain in right shoulder: Secondary | ICD-10-CM | POA: Diagnosis present

## 2021-01-08 DIAGNOSIS — M5412 Radiculopathy, cervical region: Secondary | ICD-10-CM | POA: Insufficient documentation

## 2021-01-08 DIAGNOSIS — M792 Neuralgia and neuritis, unspecified: Secondary | ICD-10-CM | POA: Insufficient documentation

## 2021-01-08 DIAGNOSIS — G8929 Other chronic pain: Secondary | ICD-10-CM | POA: Insufficient documentation

## 2021-01-08 DIAGNOSIS — M25512 Pain in left shoulder: Secondary | ICD-10-CM

## 2021-01-08 DIAGNOSIS — M79605 Pain in left leg: Secondary | ICD-10-CM | POA: Insufficient documentation

## 2021-01-08 DIAGNOSIS — M25572 Pain in left ankle and joints of left foot: Secondary | ICD-10-CM | POA: Diagnosis present

## 2021-01-08 DIAGNOSIS — M5417 Radiculopathy, lumbosacral region: Secondary | ICD-10-CM | POA: Diagnosis present

## 2021-01-08 DIAGNOSIS — M25571 Pain in right ankle and joints of right foot: Secondary | ICD-10-CM | POA: Diagnosis present

## 2021-01-08 DIAGNOSIS — M79604 Pain in right leg: Secondary | ICD-10-CM | POA: Insufficient documentation

## 2021-01-08 DIAGNOSIS — M5442 Lumbago with sciatica, left side: Secondary | ICD-10-CM | POA: Insufficient documentation

## 2021-01-09 ENCOUNTER — Encounter: Payer: Self-pay | Admitting: Pain Medicine

## 2021-01-17 ENCOUNTER — Telehealth: Payer: Self-pay

## 2021-01-21 ENCOUNTER — Telehealth: Payer: Medicaid Other | Admitting: Pain Medicine

## 2021-02-05 NOTE — Progress Notes (Signed)
PROVIDER NOTE: Information contained herein reflects review and annotations entered in association with encounter. Interpretation of such information and data should be left to medically-trained personnel. Information provided to patient can be located elsewhere in the medical record under "Patient Instructions". Document created using STT-dictation technology, any transcriptional errors that may result from process are unintentional.    Patient: Tina Nicholson  Service Category: E/M  Provider: Gaspar Cola, MD  DOB: [birth year]  DOS: 02/06/2021  Specialty: Interventional Pain Management  MRN: [id number]  Setting: Ambulatory outpatient  PCP: Earlie Counts, FNP  Type: Established Patient    Referring Provider: Earlie Counts, FNP  Location: Office  Delivery: Face-to-face     Primary Reason(s) for Visit: Encounter for evaluation before starting new chronic pain management plan of care (Level of risk: moderate) CC: Back Pain (Lumbar bilateral left is worse ), Leg Pain (Peripheral neuropathy left is worse ), Arm Pain (Bilateral ), Hand Pain (Bilateral ), Neck Pain (Bilateral left is worse ), and Shoulder Pain (Bilateral left is worse )  HPI  Tina Nicholson is a 45 y.o. year old, female patient, who comes today for a follow-up evaluation to review the test results and decide on a treatment plan. She has Anxiety attack; Asthma with exacerbation; Asthma, extrinsic; Asthma, well controlled; Cannabis use disorder, moderate, dependence ([place]); Carpal tunnel syndrome on both sides; Cellulitis and abscess of trunk; Chronic pain syndrome; Chronic SI joint pain; Depression; Dysphagia, oropharyngeal phase; Encounter to establish care; Gastro-esophageal reflux disease without esophagitis; GERD with esophagitis; History of left breast cancer; Left renal mass; Chronic low back pain (1ry area of Pain) (Bilateral) (L>R) w/ sciatica (Left); Malignant neoplasm of breast (female) ([place]); Obesity; Obstructive sleep apnea (adult)  (pediatric); Peripheral polyneuropathy; PUD (peptic ulcer disease); Sacroiliitis ([place]); Seasonal allergic rhinitis due to pollen; Stress incontinence in female; Pharmacologic therapy; Disorder of skeletal system; Problems influencing health status; Abnormal CT scan, cervical spine; EMG (electromyogram) abnormalities; Chronic lower extremity pain (2ry area of Pain) (Bilateral) (L>R); Chronic lumbar radicular pain (L5) (Left); Foot drop (Left); Lumbosacral radiculopathy at L5 (Left); Chronic ankle pain (3ry area of Pain) (Bilateral) (L>R); Chronic shoulder pain (4th area of Pain) (Bilateral) (L>R); Chronic upper extremity pain (Left); Radicular pain of upper extremity (Left); Cervical radiculopathy at C5 (Left); Abnormal drug screen (11/14/2020); Lumbar facet joint syndrome (Bilateral); and Lumbosacral facet arthropathy (Multilevel) (Bilateral) on their problem list. Her primarily concern today is the Back Pain (Lumbar bilateral left is worse ), Leg Pain (Peripheral neuropathy left is worse ), Arm Pain (Bilateral ), Hand Pain (Bilateral ), Neck Pain (Bilateral left is worse ), and Shoulder Pain (Bilateral left is worse )  Pain Assessment: Location: Lower, Left, Right Back (see visit info for additional pain sites.) Radiating: back pain into legs , neck into shoulders and arms. Onset: More than a month ago Duration: Chronic pain Quality: Discomfort, Constant, Pins and needles, Numbness, Tingling, Aching, Sharp, Dull Severity: 8 /10 (subjective, self-reported pain score)  Effect on ADL: pain affects her alot. Timing: Constant Modifying factors: injections in the past have helped with hips. BP: 104/64  HR: 97  Tina Nicholson comes in today for a follow-up visit after her initial evaluation on 01/17/2021. Today we went over the results of her tests. These were explained in "Layman's terms". During today's appointment we went over my diagnostic impression, as well as the proposed treatment plan.  According to the  patient the primary area of pain is that of the lower back (Bilateral) (L>R).  The  patient denies any back surgery but admits to having had multiple SI joint injections on the left side done # the past 20 years by Dr. Colbert Coyer at the [facility] spine center.  She indicates having had some x-rays of the lumbar spine done by Dr. Manuella Ghazi, who referred her to our clinics.  She does admit to having tried physical therapy approximately 20 years ago with no significant benefit.  The patient indicates that when it comes to her low back pain and lower extremity pain, the low back pain is significantly worse than the lower extremity pain.   The patient's secondary area pain is that of the lower extremities (Bilateral) (L>R).  Indicates of the left lower extremity the pain goes all the way down to the top of the foot and what seems to be an L5 dermatomal distribution.  She indicates having multiple falls due to her constant tripping with the left foot which she has difficulty raising (possible weakness on dorsiflexion suggestive of L5 radiculopathy) in the case of the right lower extremity the pain goes through the posterior and anterior aspect of her thigh to the level of the knee but it does not go any further down.  She denies any surgeries, nerve blocks, physical therapy, or recent x-rays of her lower extremities.  She refers having had a bone density test that demonstrated osteopenia.   The patient's third area of pain is that of the ankle (Bilateral) (L>R).  She denies any surgeries, nerve blocks, joint injections, or physical therapy.  She is not sure if she recently had some x-rays done.   The patient's fourth area pain is that of the shoulders (Bilateral) (L>R).  Again she denies any surgeries, nerve blocks, joint injections, or physical therapy.   The patient's fifth area pain is that of the left upper extremity where the pain goes all the way down to the tip of all of her fingers except for her thumb.  She believes this  is probably a neuropathy but she also has a nerve conduction test done by Dr. Manuella Ghazi indicating that she has bilateral carpal tunnel syndrome with entrapment of the median nerve at the elbow.  The same nerve conduction test indicated that the patient may have a left C5 radiculopathy.  She describes having pain and numbness running down her left arm through the ulnar distribution but stopping at the wrist.  Again, this seems to be the dermatomal distribution for C5.   The patient is sixth problem seems to be that of a peripheral neuropathy secondary to chemotherapy use for the treatment of her left breast cancer.  The patient has a left total radical mastectomy.  She apparently takes Lyrica and Cymbalta for the neuropathy.  In considering the treatment plan options, Ms. Godfrey was reminded that I no longer take patients for medication management only. I asked her to let me know if she had no intention of taking advantage of the interventional therapies, so that we could make arrangements to provide this space to someone interested. I also made it clear that undergoing interventional therapies for the purpose of getting pain medications is very inappropriate on the part of a patient, and it will not be tolerated in this practice. This type of behavior would suggest true addiction and therefore it requires referral to an addiction specialist.   Further details on both, my assessment(s), as well as the proposed treatment plan, please see below.  Controlled Substance Pharmacotherapy Assessment REMS (Risk Evaluation and Mitigation Strategy)  Analgesic:  No chronic opioid analgesics therapy prescribed by our practice. None. Abnormal UDS (11/14/2020) positive for undisclosed carboxy-THC (marijuana). MME/day: 0 mg/day  Pill Count: None expected due to no prior prescriptions written by our practice. Janett Billow, RN  02/06/2021  3:01 PM  Sign when Signing Visit Safety precautions to be maintained throughout the  outpatient stay will include: orient to surroundings, keep bed in low position, maintain call bell within reach at all times, provide assistance with transfer out of bed and ambulation.     Monitoring: [place] PMP: PDMP reviewed during this encounter. Online review of the past 39-monthperiod previously conducted. Not applicable at this point since we have not taken over the patient's medication management yet. List of other Serum/Urine Drug Screening Test(s):  No results found. List of all UDS test(s) done:  Lab Results  Component Value Date   SUMMARY Note 11/14/2020   Last UDS on record: Summary  Date Value Ref Range Status  11/14/2020 Note  Final    Comment:    ==================================================================== Compliance Drug Analysis, Ur ==================================================================== Test                             Result       Flag       Units  Drug Present and Declared for Prescription Verification   Pregabalin                     PRESENT      EXPECTED   Methocarbamol                  PRESENT      EXPECTED   Duloxetine                     PRESENT      EXPECTED  Drug Present not Declared for Prescription Verification   Carboxy-THC                    >461         UNEXPECTED ng/mg creat    Carboxy-THC is a metabolite of tetrahydrocannabinol (THC). Source of    THC is most commonly herbal marijuana or marijuana-based products,    but THC is also present in a scheduled prescription medication.    Trace amounts of THC can be present in hemp and cannabidiol (CBD)    products. This test is not intended to distinguish between delta-9-    tetrahydrocannabinol, the predominant form of THC in most herbal or    marijuana-based products, and delta-8-tetrahydrocannabinol.  ==================================================================== Test                      Result    Flag   Units      Ref Range   Creatinine              217               mg/dL      >=20 ==================================================================== Declared Medications:  The flagging and interpretation on this report are based on the  following declared medications.  Unexpected results may arise from  inaccuracies in the declared medications.   **Note: The testing scope of this panel includes these medications:   Duloxetine (Cymbalta)  Methocarbamol (Robaxin)  Pregabalin (Lyrica)   **Note: The testing scope of this panel does not include the  following reported medications:   Albuterol (Ventolin  HFA)  Anastrozole (Arimidex)  Buspirone (Buspar)  Cetirizine (Zyrtec)  Famotidine (Pepcid)  Fluticasone (Flonase)  Fluticasone (Advair)  Montelukast (Singulair)  Pantoprazole (Protonix)  Salmeterol (Advair)  Tiotropium (Spiriva) ==================================================================== For clinical consultation, please call [phone]. ====================================================================    UDS interpretation: Unexpected findings: Undeclared illicit substance detected Medication Assessment Form: Not applicable. No opioids. Treatment compliance: Not applicable Risk Assessment Profile: Aberrant behavior: use of illicit substances Comorbid factors increasing risk of overdose: See initial evaluation. No additional risks detected today Opioid risk tool (ORT):  Opioid Risk  02/06/2021  Alcohol 0  Illegal Drugs 0  Rx Drugs 0  Alcohol 0  Illegal Drugs 0  Rx Drugs 0  Age between 16-45 years  -  History of Preadolescent Sexual Abuse -  Psychological Disease 2  ADD Negative  OCD Negative  Bipolar Negative  Depression 1  Opioid Risk Tool Scoring 3  Opioid Risk Interpretation Low Risk    ORT Scoring interpretation table:  Score <3 = Low Risk for SUD  Score between 4-7 = Moderate Risk for SUD  Score >8 = High Risk for Opioid Abuse   Risk of substance use disorder (SUD): High  Risk Mitigation Strategies:   Patient opioid safety counseling: No controlled substances prescribed. Patient-Prescriber Agreement (PPA): No agreement signed.  Controlled substance notification to other providers: None required. No opioid therapy.  Pharmacologic Plan: No opioid analgesic prescribed.             Laboratory Chemistry Profile   Renal Lab Results  Component Value Date   BUN 10 11/14/2020   CREATININE 0.66 11/14/2020   BCR 15 11/14/2020     Electrolytes Lab Results  Component Value Date   NA 143 11/14/2020   K 3.9 11/14/2020   CL 103 11/14/2020   CALCIUM 9.5 11/14/2020   MG 2.3 11/14/2020     Hepatic Lab Results  Component Value Date   AST 26 11/14/2020   ALBUMIN 4.5 11/14/2020   ALKPHOS 74 11/14/2020     ID No results found for: LYMEIGGIGMAB, HIV, SARSCOV2NAA, STAPHAUREUS, MRSAPCR, HCVAB, PREGTESTUR, RMSFIGG, QFVRPH1IGG, QFVRPH2IGG, [facility]   Bone Lab Results  Component Value Date   25OHVITD1 56 11/14/2020   25OHVITD2 52 11/14/2020   25OHVITD3 3.5 11/14/2020     Endocrine Lab Results  Component Value Date   GLUCOSE 98 11/14/2020     Neuropathy Lab Results  Component Value Date   LFYBOFBP10 258 11/14/2020     CNS No results found for: COLORCSF, APPEARCSF, RBCCOUNTCSF, WBCCSF, POLYSCSF, LYMPHSCSF, EOSCSF, PROTEINCSF, GLUCCSF, JCVIRUS, CSFOLI, IGGCSF, LABACHR, ACETBL, LABACHR, ACETBL   Inflammation (CRP: Acute  ESR: Chronic) Lab Results  Component Value Date   CRP 7 11/14/2020   ESRSEDRATE 2 11/14/2020     Rheumatology No results found for: RF, ANA, LABURIC, URICUR, LYMEIGGIGMAB, LYMEABIGMQN, HLAB27   Coagulation No results found for: INR, LABPROT, APTT, PLT, DDIMER, LABHEMA, VITAMINK1, AT3   Cardiovascular No results found for: BNP, CKTOTAL, CKMB, TROPONINI, HGB, HCT, LABVMA, EPIRU, EPINEPH24HUR, NOREPRU, NOREPI24HUR, DOPARU, DOPAM24HRUR   Screening No results found for: SARSCOV2NAA, COVIDSOURCE, STAPHAUREUS, MRSAPCR, HCVAB, HIV, PREGTESTUR   Cancer No  results found for: CEA, CA125, LABCA2   Allergens No results found for: ALMOND, APPLE, ASPARAGUS, AVOCADO, BANANA, BARLEY, BASIL, BAYLEAF, GREENBEAN, LIMABEAN, WHITEBEAN, BEEFIGE, REDBEET, BLUEBERRY, BROCCOLI, CABBAGE, MELON, CARROT, CASEIN, CASHEWNUT, CAULIFLOWER, CELERY     Note: Lab results reviewed.  Recent Diagnostic Imaging Review  Cervical Imaging: Cervical CT wo contrast: Results for orders placed during the hospital encounter of 02/08/19  CT Cervical Spine Wo Contrast  Narrative CLINICAL DATA:  Worsening headache after falling 2 weeks ago.  EXAM: CT HEAD WITHOUT CONTRAST  CT CERVICAL SPINE WITHOUT CONTRAST  TECHNIQUE: Multidetector CT imaging of the head and cervical spine was performed following the standard protocol without intravenous contrast. Multiplanar CT image reconstructions of the cervical spine were also generated.  COMPARISON:  None.  FINDINGS: CT HEAD FINDINGS  Brain: There is no mass, hemorrhage or extra-axial collection. The size and configuration of the ventricles and extra-axial CSF spaces are normal. The brain parenchyma is normal, without evidence of acute or chronic infarction.  Vascular: No abnormal hyperdensity of the major intracranial arteries or dural venous sinuses. No intracranial atherosclerosis.  Skull: The visualized skull base, calvarium and extracranial soft tissues are normal.  Sinuses/Orbits: No fluid levels or advanced mucosal thickening of the visualized paranasal sinuses. No mastoid or middle ear effusion. The orbits are normal.  CT CERVICAL SPINE FINDINGS  Alignment: No static subluxation. Facets are aligned. Occipital condyles are normally positioned.  Skull base and vertebrae: No acute fracture.  Soft tissues and spinal canal: No prevertebral fluid or swelling. No visible canal hematoma.  Disc levels: No advanced spinal canal or neural foraminal stenosis.  Upper chest: No pneumothorax, pulmonary nodule or  pleural effusion.  Other: Normal visualized paraspinal cervical soft tissues.  IMPRESSION: No acute abnormality of the head or cervical spine.   Electronically Signed By: Ulyses Jarred M.D. On: 02/07/2019 23:47  Cervical DG Bending/F/E views: Results for orders placed during the hospital encounter of 01/08/21 DG Cervical Spine With Flex & Extend  Narrative CLINICAL DATA:  Neck pain  EXAM: CERVICAL SPINE COMPLETE WITH FLEXION AND EXTENSION VIEWS  COMPARISON:  CT 02/07/2019  FINDINGS: Straightening of the cervical spine. Suboptimal visualization of C7 and cervicothoracic junction. Vertebral body heights are maintained. Mild disc space narrowing C5-C6 and C6-C7. No suspicious change in alignment with flexion or extension. The dens and lateral masses are within normal limits. Evaluation of right foramen is limited by positioning. Possible mild left foraminal narrowing at C6-C7.  IMPRESSION: Straightening of the cervical spine with suboptimal visualization of C7 and cervicothoracic junction. Mild disc space narrowing and degenerative change at C5-C6 and C6-C7.   Electronically Signed By: Donavan Foil M.D. On: 01/08/2021 23:25  Shoulder Imaging: Shoulder-R DG: Results for orders placed during the hospital encounter of 01/08/21 DG Shoulder Right  Narrative CLINICAL DATA:  Shoulder pain  EXAM: RIGHT SHOULDER - 2+ VIEW  COMPARISON:  None.  FINDINGS: Mild AC joint degenerative change.  No fracture or malalignment.  IMPRESSION: Mild AC joint degenerative change   Electronically Signed By: Donavan Foil M.D. On: 01/08/2021 23:10  Shoulder-L DG: Results for orders placed during the hospital encounter of 01/08/21 DG Shoulder Left  Narrative CLINICAL DATA:  Shoulder pain  EXAM: LEFT SHOULDER - 2+ VIEW  COMPARISON:  None.  FINDINGS: No fracture or malalignment. Mild AC joint degenerative change. Clips in the left axilla  IMPRESSION: Mild AC joint  degenerative change   Electronically Signed By: Donavan Foil M.D. On: 01/08/2021 23:19  Lumbosacral Imaging: Lumbar DG Bending views: Results for orders placed during the hospital encounter of 01/08/21 DG Lumbar Spine Complete W/Bend  Narrative CLINICAL DATA:  Chronic low back pain  EXAM: LUMBAR SPINE - COMPLETE WITH BENDING VIEWS  COMPARISON:  None.  FINDINGS: Faint calcifications in the right upper quadrant, possibly gallstones.  Suspected transitional anatomy with 4 non rib-bearing lumbar type vertebra. Transitional vertebra will be  designated sacralized L5.  Alignment within normal limits. No suspicious change in alignment with flexion or extension. Rudimentary disc at L5-S1. Mild disc space narrowing and degenerative osteophyte at L1-L2 with minor osteophyte at L2-L3. Mild facet degenerative changes/narrowing at L3-L4 and L4-L5 bilaterally. No definitive pars defect identified.  IMPRESSION: 1. Suspected transitional anatomy as above. 2. Mild multilevel degenerative changes 3. Probable gallstones   Electronically Signed By: Donavan Foil M.D. On: 01/08/2021 23:19  Ankle Imaging: Ankle-R DG Complete: Results for orders placed during the hospital encounter of 01/08/21 DG Ankle Complete Right  Narrative CLINICAL DATA:  Ankle pain  EXAM: RIGHT ANKLE - COMPLETE 3+ VIEW  COMPARISON:  None.  FINDINGS: No fracture or malalignment. Minimal degenerative spurring medially. Small plantar calcaneal spur. Ankle mortise is symmetric  IMPRESSION: Minimal degenerative change   Electronically Signed By: Donavan Foil M.D. On: 01/08/2021 23:20  Ankle-L DG Complete: Results for orders placed during the hospital encounter of 01/08/21 DG Ankle Complete Left  Narrative CLINICAL DATA:  Chronic ankle pain  EXAM: LEFT ANKLE COMPLETE - 3+ VIEW  COMPARISON:  None.  FINDINGS: No fracture or malalignment. Minimal degenerative spurring at the medial joint space.  The ankle mortise is symmetric. Moderate plantar calcaneal spur  IMPRESSION: Minimal degenerative change   Electronically Signed By: Donavan Foil M.D. On: 01/08/2021 23:20  Complexity Note: Imaging results reviewed. Results shared with Ms. Singleterry, using Layman's terms.                        Meds   Current Outpatient Medications:    anastrozole (ARIMIDEX) 1 MG tablet, Take by mouth., Disp: , Rfl:    busPIRone (BUSPAR) 7.5 MG tablet, Take by mouth., Disp: , Rfl:    cetirizine (ZYRTEC) 10 MG tablet, Take by mouth., Disp: , Rfl:    DULoxetine (CYMBALTA) 60 MG capsule, Take 60 mg by mouth daily., Disp: , Rfl:    famotidine (PEPCID) 40 MG tablet, Take by mouth., Disp: , Rfl:    fluticasone (FLONASE) 50 MCG/ACT nasal spray, Place into the nose., Disp: , Rfl:    methocarbamol (ROBAXIN) 500 MG tablet, Take 1,000 mg by mouth every 8 (eight) hours as needed., Disp: , Rfl:    montelukast (SINGULAIR) 10 MG tablet, Take by mouth., Disp: , Rfl:    pantoprazole (PROTONIX) 40 MG tablet, Take by mouth., Disp: , Rfl:    pregabalin (LYRICA) 75 MG capsule, 150 mg 3 (three) times daily., Disp: , Rfl:    TRELEGY ELLIPTA 200-62.5-25 MCG/INH AEPB, Inhale 1 puff into the lungs daily., Disp: , Rfl:    Vitamin D, Ergocalciferol, (DRISDOL) 1.25 MG (50000 UNIT) CAPS capsule, Take 50,000 Units by mouth once a week., Disp: , Rfl:    albuterol (VENTOLIN HFA) 108 (90 Base) MCG/ACT inhaler, Inhale into the lungs., Disp: , Rfl:   ROS  Constitutional: Denies any fever or chills Gastrointestinal: No reported hemesis, hematochezia, vomiting, or acute GI distress Musculoskeletal: Denies any acute onset joint swelling, redness, loss of ROM, or weakness Neurological: No reported episodes of acute onset apraxia, aphasia, dysarthria, agnosia, amnesia, paralysis, loss of coordination, or loss of consciousness  Allergies  Ms. Smullen is allergic to sulfa antibiotics.  [place]annie  Drug: Ms. Kolton  reports previous drug  use. Alcohol:  reports previous alcohol use. Tobacco:  reports that she quit smoking about 14 months ago. Her smoking use included cigarettes. She started smoking about 28 years ago. She has never used smokeless tobacco. Medical:  has a past medical history of [facility]), Disc degeneration, lumbar, Neuropathy, Other cervical disc degeneration at C6-C7 level, and Other intervertebral disc degeneration, thoracic region. Surgical: Ms. Graeff  has a past surgical history that includes Breast surgery and Abdominal hysterectomy. Family: family history is not on file.  Constitutional Exam  General appearance: Well nourished, well developed, and well hydrated. In no apparent acute distress Vitals:   02/06/21 1505  BP: 104/64  Pulse: 97  Resp: 16  Temp: (!) 97.1 F (36.2 C)  TempSrc: Temporal  SpO2: 98%  Weight: 215 lb (97.5 kg)  Height: 5' 1" (1.549 m)   BMI Assessment: Estimated body mass index is 40.62 kg/m as calculated from the following:   Height as of this encounter: 5' 1" (1.549 m).   Weight as of this encounter: 215 lb (97.5 kg).  BMI interpretation table: BMI level Category Range association with higher incidence of chronic pain  <18 kg/m2 Underweight   18.5-24.9 kg/m2 Ideal body weight   25-29.9 kg/m2 Overweight Increased incidence by 20%  30-34.9 kg/m2 Obese (Class I) Increased incidence by 68%  35-39.9 kg/m2 Severe obesity (Class II) Increased incidence by 136%  >40 kg/m2 Extreme obesity (Class III) Increased incidence by 254%   Patient's current BMI Ideal Body weight  Body mass index is 40.62 kg/m. Ideal body weight: 47.8 kg (105 lb 6.1 oz) Adjusted ideal body weight: 67.7 kg (149 lb 3.6 oz)   BMI Readings from Last 4 Encounters:  02/06/21 40.62 kg/m  11/14/20 41.21 kg/m  02/07/19 37.79 kg/m  02/07/19 37.79 kg/m   Wt Readings from Last 4 Encounters:  02/06/21 215 lb (97.5 kg)  11/14/20 211 lb (95.7 kg)  02/07/19 200 lb (90.7 kg)  02/07/19 200 lb (90.7 kg)     Psych/Mental status: Alert, oriented x 3 (person, place, & time)       Eyes: PERLA Respiratory: No evidence of acute respiratory distress  Assessment & Plan  Primary Diagnosis & Pertinent Problem List: The primary encounter diagnosis was Chronic pain syndrome. Diagnoses of Chronic low back pain (1ry area of Pain) (Bilateral) (L>R) w/ sciatica (Left), Chronic lower extremity pain (2ry area of Pain) (Bilateral) (L>R), Chronic ankle pain (3ry area of Pain) (Bilateral) (L>R), Chronic shoulder pain (4th area of Pain) (Bilateral) (L>R), Abnormal drug screen (11/14/2020), Lumbar facet joint syndrome (Bilateral), and Lumbosacral facet arthropathy (Multilevel) (Bilateral) were also pertinent to this visit.  Visit Diagnosis: 1. Chronic pain syndrome   2. Chronic low back pain (1ry area of Pain) (Bilateral) (L>R) w/ sciatica (Left)   3. Chronic lower extremity pain (2ry area of Pain) (Bilateral) (L>R)   4. Chronic ankle pain (3ry area of Pain) (Bilateral) (L>R)   5. Chronic shoulder pain (4th area of Pain) (Bilateral) (L>R)   6. Abnormal drug screen (11/14/2020)   7. Lumbar facet joint syndrome (Bilateral)   8. Lumbosacral facet arthropathy (Multilevel) (Bilateral)    Problems updated and reviewed during this visit: Problem  Lumbar facet joint syndrome (Bilateral)  Lumbosacral facet arthropathy (Multilevel) (Bilateral)  Abnormal drug screen (11/14/2020)   Abnormal UDS (11/14/2020) positive for undisclosed carboxy-THC (marijuana)      Plan of Care  Pharmacotherapy (Medications Ordered): No orders of the defined types were placed in this encounter.  Procedure Orders  LUMBAR FACET(MEDIAL BRANCH NERVE BLOCK) MBNB   Lab Orders  No laboratory test(s) ordered today   Imaging Orders  No imaging studies ordered today   Referral Orders  No referral(s) requested today    Pharmacological  management options:  Opioid Analgesics: I will not be prescribing any opioids at this time Membrane  stabilizer: I will not be prescribing any at this time Muscle relaxant: I will not be prescribing any at this time NSAID: I will not be prescribing any at this time Other analgesic(s): I will not be prescribing any at this time     Interventional Therapies  Risk  Complexity Considerations:   Estimated body mass index is 40.62 kg/m as calculated from the following:   Height as of this encounter: 5' 1" (1.549 m).   Weight as of this encounter: 215 lb (97.5 kg). Abnormal UDS (11/14/2020) positive for undisclosed carboxy-THC (marijuana)   Planned  Pending:   Diagnostic bilateral lumbar facet block #1    Under consideration:   Diagnostic bilateral lumbar facet block #1    Completed:   None at this time   Therapeutic  Palliative (PRN) options:   None established    Provider-requested follow-up: Return for Procedure (no sedation): (B) L-FCT BLK #1. Recent Visits Date Type Provider Dept  11/14/20 Office Visit Milinda Pointer, MD [facility]  Showing recent visits within past 90 days and meeting all other requirements Today's Visits Date Type Provider Dept  02/06/21 Office Visit Milinda Pointer, MD [facility]  Showing today's visits and meeting all other requirements Future Appointments No visits were found meeting these conditions. Showing future appointments within next 90 days and meeting all other requirements Primary Care Physician: Earlie Counts, FNP Note by: Gaspar Cola, MD Date: 02/06/2021; Time: 4:20 PM

## 2021-02-06 ENCOUNTER — Ambulatory Visit: Payer: Medicaid Other | Attending: Pain Medicine | Admitting: Pain Medicine

## 2021-02-06 ENCOUNTER — Other Ambulatory Visit: Payer: Self-pay

## 2021-02-06 ENCOUNTER — Encounter: Payer: Self-pay | Admitting: Pain Medicine

## 2021-02-06 VITALS — BP 104/64 | HR 97 | Temp 97.1°F | Resp 16 | Ht 61.0 in | Wt 215.0 lb

## 2021-02-06 DIAGNOSIS — M5442 Lumbago with sciatica, left side: Secondary | ICD-10-CM | POA: Diagnosis present

## 2021-02-06 DIAGNOSIS — R892 Abnormal level of other drugs, medicaments and biological substances in specimens from other organs, systems and tissues: Secondary | ICD-10-CM | POA: Diagnosis present

## 2021-02-06 DIAGNOSIS — M25571 Pain in right ankle and joints of right foot: Secondary | ICD-10-CM | POA: Insufficient documentation

## 2021-02-06 DIAGNOSIS — M79604 Pain in right leg: Secondary | ICD-10-CM | POA: Diagnosis present

## 2021-02-06 DIAGNOSIS — M47816 Spondylosis without myelopathy or radiculopathy, lumbar region: Secondary | ICD-10-CM | POA: Insufficient documentation

## 2021-02-06 DIAGNOSIS — M79605 Pain in left leg: Secondary | ICD-10-CM | POA: Insufficient documentation

## 2021-02-06 DIAGNOSIS — G8929 Other chronic pain: Secondary | ICD-10-CM | POA: Diagnosis present

## 2021-02-06 DIAGNOSIS — M47817 Spondylosis without myelopathy or radiculopathy, lumbosacral region: Secondary | ICD-10-CM | POA: Insufficient documentation

## 2021-02-06 DIAGNOSIS — M25572 Pain in left ankle and joints of left foot: Secondary | ICD-10-CM | POA: Insufficient documentation

## 2021-02-06 DIAGNOSIS — M25511 Pain in right shoulder: Secondary | ICD-10-CM | POA: Insufficient documentation

## 2021-02-06 DIAGNOSIS — G894 Chronic pain syndrome: Secondary | ICD-10-CM | POA: Insufficient documentation

## 2021-02-06 DIAGNOSIS — M25512 Pain in left shoulder: Secondary | ICD-10-CM | POA: Insufficient documentation

## 2021-02-06 NOTE — Patient Instructions (Signed)
______________________________________________________________________  Preparing for your procedure (without sedation)  Procedure appointments are limited to planned procedures: No Prescription Refills. No disability issues will be discussed. No medication changes will be discussed.  Instructions: Oral Intake: Do not eat or drink anything for at least 6 hours prior to your procedure. (Exception: Blood Pressure Medication. See below.) Transportation: Unless otherwise stated by your physician, you may drive yourself after the procedure. Blood Pressure Medicine: Do not forget to take your blood pressure medicine with a sip of water the morning of the procedure. If your Diastolic (lower reading)is above 100 mmHg, elective cases will be cancelled/rescheduled. Blood thinners: These will need to be stopped for procedures. Notify our staff if you are taking any blood thinners. Depending on which one you take, there will be specific instructions on how and when to stop it. Diabetics on insulin: Notify the staff so that you can be scheduled 1st case in the morning. If your diabetes requires high dose insulin, take only  of your normal insulin dose the morning of the procedure and notify the staff that you have done so. Preventing infections: Shower with an antibacterial soap the morning of your procedure.  Build-up your immune system: Take 1000 mg of Vitamin C with every meal (3 times a day) the day prior to your procedure. Antibiotics: Inform the staff if you have a condition or reason that requires you to take antibiotics before dental procedures. Pregnancy: If you are pregnant, call and cancel the procedure. Sickness: If you have a cold, fever, or any active infections, call and cancel the procedure. Arrival: You must be in the facility at least 30 minutes prior to your scheduled procedure. Children: Do not bring any children with you. Dress appropriately: Bring dark clothing that you would not mind  if they get stained. Valuables: Do not bring any jewelry or valuables.  Reasons to call and reschedule or cancel your procedure: (Following these recommendations will minimize the risk of a serious complication.) Surgeries: Avoid having procedures within 2 weeks of any surgery. (Avoid for 2 weeks before or after any surgery). Flu Shots: Avoid having procedures within 2 weeks of a flu shots or . (Avoid for 2 weeks before or after immunizations). Barium: Avoid having a procedure within 7-10 days after having had a radiological study involving the use of radiological contrast. (Myelograms, Barium swallow or enema study). Heart attacks: Avoid any elective procedures or surgeries for the initial 6 months after a "Myocardial Infarction" (Heart Attack). Blood thinners: It is imperative that you stop these medications before procedures. Let us know if you if you take any blood thinner.  Infection: Avoid procedures during or within two weeks of an infection (including chest colds or gastrointestinal problems). Symptoms associated with infections include: Localized redness, fever, chills, night sweats or profuse sweating, burning sensation when voiding, cough, congestion, stuffiness, runny nose, sore throat, diarrhea, nausea, vomiting, cold or Flu symptoms, recent or current infections. It is specially important if the infection is over the area that we intend to treat. Heart and lung problems: Symptoms that may suggest an active cardiopulmonary problem include: cough, chest pain, breathing difficulties or shortness of breath, dizziness, ankle swelling, uncontrolled high or unusually low blood pressure, and/or palpitations. If you are experiencing any of these symptoms, cancel your procedure and contact your primary care physician for an evaluation.  Remember:  Regular Business hours are:  Monday to Thursday 8:00 AM to 4:00 PM  Provider's Schedule: Chukwuebuka Churchill, MD:  Procedure days: Tuesday and Thursday    7:30 AM to 4:00 PM  Bilal Lateef, MD:  Procedure days: Monday and Wednesday 7:30 AM to 4:00 PM ______________________________________________________________________  ____________________________________________________________________________________________  General Risks and Possible Complications  Patient Responsibilities: It is important that you read this as it is part of your informed consent. It is our duty to inform you of the risks and possible complications associated with treatments offered to you. It is your responsibility as a patient to read this and to ask questions about anything that is not clear or that you believe was not covered in this document.  Patient's Rights: You have the right to refuse treatment. You also have the right to change your mind, even after initially having agreed to have the treatment done. However, under this last option, if you wait until the last second to change your mind, you may be charged for the materials used up to that point.  Introduction: Medicine is not an exact science. Everything in Medicine, including the lack of treatment(s), carries the potential for danger, harm, or loss (which is by definition: Risk). In Medicine, a complication is a secondary problem, condition, or disease that can aggravate an already existing one. All treatments carry the risk of possible complications. The fact that a side effects or complications occurs, does not imply that the treatment was conducted incorrectly. It must be clearly understood that these can happen even when everything is done following the highest safety standards.  No treatment: You can choose not to proceed with the proposed treatment alternative. The "PRO(s)" would include: avoiding the risk of complications associated with the therapy. The "CON(s)" would include: not getting any of the treatment benefits. These benefits fall under one of three categories: diagnostic; therapeutic; and/or palliative.  Diagnostic benefits include: getting information which can ultimately lead to improvement of the disease or symptom(s). Therapeutic benefits are those associated with the successful treatment of the disease. Finally, palliative benefits are those related to the decrease of the primary symptoms, without necessarily curing the condition (example: decreasing the pain from a flare-up of a chronic condition, such as incurable terminal cancer).  General Risks and Complications: These are associated to most interventional treatments. They can occur alone, or in combination. They fall under one of the following six (6) categories: no benefit or worsening of symptoms; bleeding; infection; nerve damage; allergic reactions; and/or death. No benefits or worsening of symptoms: In Medicine there are no guarantees, only probabilities. No healthcare provider can ever guarantee that a medical treatment will work, they can only state the probability that it may. Furthermore, there is always the possibility that the condition may worsen, either directly, or indirectly, as a consequence of the treatment. Bleeding: This is more common if the patient is taking a blood thinner, either prescription or over the counter (example: Goody Powders, Fish oil, Aspirin, Garlic, etc.), or if suffering a condition associated with impaired coagulation (example: Hemophilia, cirrhosis of the liver, low platelet counts, etc.). However, even if you do not have one on these, it can still happen. If you have any of these conditions, or take one of these drugs, make sure to notify your treating physician. Infection: This is more common in patients with a compromised immune system, either due to disease (example: diabetes, cancer, human immunodeficiency virus [HIV], etc.), or due to medications or treatments (example: therapies used to treat cancer and rheumatological diseases). However, even if you do not have one on these, it can still happen. If you  have any of these conditions, or take   one of these drugs, make sure to notify your treating physician. Nerve Damage: This is more common when the treatment is an invasive one, but it can also happen with the use of medications, such as those used in the treatment of cancer. The damage can occur to small secondary nerves, or to large primary ones, such as those in the spinal cord and brain. This damage may be temporary or permanent and it may lead to impairments that can range from temporary numbness to permanent paralysis and/or brain death. Allergic Reactions: Any time a substance or material comes in contact with our body, there is the possibility of an allergic reaction. These can range from a mild skin rash (contact dermatitis) to a severe systemic reaction (anaphylactic reaction), which can result in death. Death: In general, any medical intervention can result in death, most of the time due to an unforeseen complication. ____________________________________________________________________________________________  

## 2021-02-06 NOTE — Progress Notes (Signed)
Safety precautions to be maintained throughout the outpatient stay will include: orient to surroundings, keep bed in low position, maintain call bell within reach at all times, provide assistance with transfer out of bed and ambulation.  

## 2021-02-08 ENCOUNTER — Encounter: Payer: Self-pay | Admitting: Pain Medicine

## 2021-02-11 NOTE — Telephone Encounter (Signed)
Bubble message sent to Dr. Naveira. 

## 2021-02-19 ENCOUNTER — Encounter: Payer: Self-pay | Admitting: Pain Medicine
